# Patient Record
Sex: Female | Born: 1990 | Hispanic: No | Smoking: Never smoker
Health system: Southern US, Community
[De-identification: ages and names within clinical notes are randomized; demographics above are authoritative.]

## PROBLEM LIST (undated history)

## (undated) DIAGNOSIS — M79A29 Nontraumatic compartment syndrome of unspecified lower extremity: Secondary | ICD-10-CM

## (undated) DIAGNOSIS — G8929 Other chronic pain: Secondary | ICD-10-CM

## (undated) DIAGNOSIS — R110 Nausea: Secondary | ICD-10-CM

## (undated) DIAGNOSIS — G9050 Complex regional pain syndrome I, unspecified: Secondary | ICD-10-CM

## (undated) DIAGNOSIS — M545 Low back pain: Secondary | ICD-10-CM

## (undated) HISTORY — PX: TONSILLECTOMY: SUR1361

## (undated) HISTORY — DX: Low back pain: M54.5

## (undated) HISTORY — DX: Complex regional pain syndrome I, unspecified: G90.50

## (undated) HISTORY — DX: Nontraumatic compartment syndrome of unspecified lower extremity: M79.A29

## (undated) HISTORY — PX: LASIK: SHX215

## (undated) HISTORY — PX: FASCIOTOMY: SHX132

## (undated) HISTORY — DX: Other chronic pain: G89.29

## (undated) HISTORY — DX: Nausea: R11.0

---
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
## 2010-06-08 DIAGNOSIS — M79604 Pain in right leg: Secondary | ICD-10-CM | POA: Insufficient documentation

## 2010-09-24 DIAGNOSIS — M79A29 Nontraumatic compartment syndrome of unspecified lower extremity: Secondary | ICD-10-CM

## 2010-09-24 HISTORY — DX: Nontraumatic compartment syndrome of unspecified lower extremity: M79.A29

## 2010-11-08 ENCOUNTER — Inpatient Hospital Stay (INDEPENDENT_AMBULATORY_CARE_PROVIDER_SITE_OTHER)
Admission: RE | Admit: 2010-11-08 | Discharge: 2010-11-08 | Disposition: A | Discharge status: Home / Self Care | Payer: 59 | Source: Ambulatory Visit | Attending: Family Medicine | Admitting: Family Medicine

## 2010-11-08 DIAGNOSIS — L02419 Cutaneous abscess of limb, unspecified: Secondary | ICD-10-CM

## 2010-11-08 DIAGNOSIS — L03119 Cellulitis of unspecified part of limb: Secondary | ICD-10-CM

## 2010-11-18 ENCOUNTER — Inpatient Hospital Stay (INDEPENDENT_AMBULATORY_CARE_PROVIDER_SITE_OTHER)
Admission: RE | Admit: 2010-11-18 | Discharge: 2010-11-18 | Disposition: A | Discharge status: Home / Self Care | Payer: 59 | Source: Ambulatory Visit | Attending: Family Medicine | Admitting: Family Medicine

## 2010-11-18 DIAGNOSIS — L02419 Cutaneous abscess of limb, unspecified: Secondary | ICD-10-CM

## 2010-11-18 DIAGNOSIS — L03119 Cellulitis of unspecified part of limb: Secondary | ICD-10-CM

## 2011-06-29 DIAGNOSIS — G8929 Other chronic pain: Secondary | ICD-10-CM | POA: Insufficient documentation

## 2011-06-29 DIAGNOSIS — IMO0002 Reserved for concepts with insufficient information to code with codable children: Secondary | ICD-10-CM | POA: Insufficient documentation

## 2011-10-13 DIAGNOSIS — G9050 Complex regional pain syndrome I, unspecified: Secondary | ICD-10-CM

## 2011-10-13 HISTORY — DX: Complex regional pain syndrome I, unspecified: G90.50

## 2014-05-24 DIAGNOSIS — E669 Obesity, unspecified: Secondary | ICD-10-CM | POA: Insufficient documentation

## 2014-10-21 ENCOUNTER — Other Ambulatory Visit (HOSPITAL_COMMUNITY): Payer: Self-pay | Admitting: Orthopedic Surgery

## 2014-10-21 DIAGNOSIS — M84369A Stress fracture, unspecified tibia and fibula, initial encounter for fracture: Secondary | ICD-10-CM

## 2014-10-25 ENCOUNTER — Ambulatory Visit (HOSPITAL_COMMUNITY)
Admission: RE | Admit: 2014-10-25 | Discharge: 2014-10-25 | Disposition: A | Discharge status: Home / Self Care | Payer: 59 | Source: Ambulatory Visit | Attending: Orthopedic Surgery | Admitting: Orthopedic Surgery

## 2014-10-25 ENCOUNTER — Encounter (HOSPITAL_COMMUNITY)
Admission: RE | Admit: 2014-10-25 | Discharge: 2014-10-25 | Disposition: A | Discharge status: Home / Self Care | Payer: 59 | Source: Ambulatory Visit | Attending: Orthopedic Surgery | Admitting: Orthopedic Surgery

## 2014-10-25 DIAGNOSIS — M84369A Stress fracture, unspecified tibia and fibula, initial encounter for fracture: Secondary | ICD-10-CM

## 2014-10-25 DIAGNOSIS — M79606 Pain in leg, unspecified: Secondary | ICD-10-CM | POA: Diagnosis not present

## 2014-10-25 MED ORDER — TECHNETIUM TC 99M MEDRONATE IV KIT
25.0000 | PACK | Freq: Once | INTRAVENOUS | Status: AC | PRN
  Administered 2014-10-25: 25 via INTRAVENOUS

## 2015-03-03 DIAGNOSIS — R252 Cramp and spasm: Secondary | ICD-10-CM | POA: Insufficient documentation

## 2016-02-11 DIAGNOSIS — R110 Nausea: Secondary | ICD-10-CM

## 2016-02-11 DIAGNOSIS — M545 Low back pain, unspecified: Secondary | ICD-10-CM | POA: Insufficient documentation

## 2016-02-11 DIAGNOSIS — G8929 Other chronic pain: Secondary | ICD-10-CM

## 2016-02-11 HISTORY — DX: Nausea: R11.0

## 2016-02-11 HISTORY — DX: Low back pain, unspecified: M54.50

## 2016-02-11 HISTORY — DX: Other chronic pain: G89.29

## 2016-05-26 DIAGNOSIS — Z7189 Other specified counseling: Secondary | ICD-10-CM | POA: Diagnosis not present

## 2016-10-21 DIAGNOSIS — J209 Acute bronchitis, unspecified: Secondary | ICD-10-CM | POA: Diagnosis not present

## 2017-04-01 DIAGNOSIS — M79605 Pain in left leg: Secondary | ICD-10-CM | POA: Diagnosis not present

## 2017-04-01 DIAGNOSIS — M25561 Pain in right knee: Secondary | ICD-10-CM | POA: Diagnosis not present

## 2017-04-18 ENCOUNTER — Other Ambulatory Visit: Payer: Self-pay | Admitting: Sports Medicine

## 2017-04-18 DIAGNOSIS — M79605 Pain in left leg: Secondary | ICD-10-CM

## 2017-04-18 DIAGNOSIS — M25561 Pain in right knee: Secondary | ICD-10-CM

## 2017-04-27 ENCOUNTER — Ambulatory Visit
Admission: RE | Admit: 2017-04-27 | Discharge: 2017-04-27 | Disposition: A | Discharge status: Home / Self Care | Payer: 59 | Source: Ambulatory Visit | Attending: Sports Medicine | Admitting: Sports Medicine

## 2017-04-27 DIAGNOSIS — M25561 Pain in right knee: Secondary | ICD-10-CM | POA: Diagnosis not present

## 2017-04-27 DIAGNOSIS — M79662 Pain in left lower leg: Secondary | ICD-10-CM | POA: Diagnosis not present

## 2017-04-27 DIAGNOSIS — M79605 Pain in left leg: Secondary | ICD-10-CM

## 2017-04-27 MED ORDER — GADOBENATE DIMEGLUMINE 529 MG/ML IV SOLN
20.0000 mL | Freq: Once | INTRAVENOUS | Status: AC | PRN
  Administered 2017-04-27: 20 mL via INTRAVENOUS

## 2017-05-03 DIAGNOSIS — R1013 Epigastric pain: Secondary | ICD-10-CM | POA: Diagnosis not present

## 2017-05-03 DIAGNOSIS — K219 Gastro-esophageal reflux disease without esophagitis: Secondary | ICD-10-CM | POA: Diagnosis not present

## 2017-05-03 DIAGNOSIS — R300 Dysuria: Secondary | ICD-10-CM | POA: Diagnosis not present

## 2017-05-09 DIAGNOSIS — M25561 Pain in right knee: Secondary | ICD-10-CM | POA: Diagnosis not present

## 2017-05-09 DIAGNOSIS — M25562 Pain in left knee: Secondary | ICD-10-CM | POA: Diagnosis not present

## 2017-08-18 DIAGNOSIS — J0100 Acute maxillary sinusitis, unspecified: Secondary | ICD-10-CM | POA: Diagnosis not present

## 2017-12-07 DIAGNOSIS — Z01419 Encounter for gynecological examination (general) (routine) without abnormal findings: Secondary | ICD-10-CM | POA: Diagnosis not present

## 2018-02-22 ENCOUNTER — Ambulatory Visit (INDEPENDENT_AMBULATORY_CARE_PROVIDER_SITE_OTHER): Payer: 59 | Admitting: Family Medicine

## 2018-02-22 ENCOUNTER — Encounter: Payer: Self-pay | Admitting: Family Medicine

## 2018-02-22 ENCOUNTER — Ambulatory Visit (INDEPENDENT_AMBULATORY_CARE_PROVIDER_SITE_OTHER): Payer: 59

## 2018-02-22 VITALS — BP 144/76 | HR 85 | Ht 72.0 in | Wt 289.0 lb

## 2018-02-22 DIAGNOSIS — G8929 Other chronic pain: Secondary | ICD-10-CM

## 2018-02-22 DIAGNOSIS — M25512 Pain in left shoulder: Secondary | ICD-10-CM

## 2018-02-22 NOTE — Patient Instructions (Signed)
Thank you for coming in today.  You should hear about MRI scheduling soon.  Let me know if you do not hear anything.    SLAP Lesions Superior labrum anterior posterior (SLAP) lesions are injuries to part of the connective tissue (cartilage) of the shoulder joint. The top of the upper arm bone (humerus) fits into a socket in the shoulder blade to form the shoulder joint. There is a firm rim of cartilage (labrum) around the edge of the socket. The labrum helps to deepen the socket and hold the humerus in place. If a certain part of the labrum becomes frayed or torn, it is called a SLAP lesion. A SLAP lesion can cause shoulder pain, instability, and weakness. SLAP lesions are common among athletes who play sports that involve repeated overhead movements. SLAP lesions may include a tear in the cord of tissue that attaches the muscle in the front of the upper arm to the shoulder blade (proximal biceps tendon). What are the causes? This condition may be caused by:  A sudden (acute) injury, which can result from: ? Falling on an outstretched arm. ? Movement of the shoulder joint out of its normal place (dislocation). ? A direct hit to the shoulder.  Wear and tear over time, which can result from doing activities or sports that involve overhead arm movements.  What increases the risk? The following factors may make you more likely to develop a SLAP lesion:  Having had a dislocated shoulder in the past.  Being age 27 or older.  Playing certain sports, such as: ? Sports that involve repeated overhead movements, such as baseball or volleyball. ? Sports that put backward pressure on the arms when the arms are overhead, such as gymnastics or basketball. ? Contact sports.  Lifting weights.  What are the signs or symptoms? The main symptom of this condition is shoulder pain that gets worse when lifting a heavy object or raising the arm overhead. Other signs and symptoms may include:  Feeling  like your shoulder is locking, catching, grinding, or popping.  Loss of strength.  Stiffness and limited range of motion.  Loss of throwing power ("dead arm").  How is this diagnosed? This condition may be diagnosed based on:  Your symptoms.  Your medical history.  A physical exam.  Imaging tests, such as MRIs.  How is this treated? Treatment for this condition may include:  Resting your shoulder by avoiding activities that cause shoulder pain.  NSAIDs to help reduce pain and swelling.  Physical therapy to improve strength and range of motion.  Surgery. This may be done if other treatment methods do not help. Surgery may involve: ? Removing frayed pieces of the labrum. ? Repairing tears. ? Reattaching the labrum. ? Repairing the biceps tendon.  Follow these instructions at home: Managing pain, stiffness, and swelling  If directed, put ice on the injured area. ? Put ice in a plastic bag. ? Place a towel between your skin and the bag. ? Leave the ice on for 20 minutes, 2-3 times a day. Driving  Do not drive or operate heavy machinery while taking prescription pain medicine.  Ask your health care provider when it is safe for you to drive. Activity  Return to your normal activities as told by your health care provider. Ask your health care provider what activities are safe for you.  Do exercises as told by your health care provider. General instructions   Do not use any tobacco products, such as cigarettes, chewing  tobacco, or e-cigarettes. Tobacco can delay bone healing. If you need help quitting, ask your health care provider.  Take over-the-counter and prescription medicines only as told by your health care provider.  Keep all follow-up visits as told by your health care provider. This is important. How is this prevented?  Be safe and responsible while being active to avoid falls.  Maintain physical fitness, including strength and flexibility. Contact a  health care provider if:  Your symptoms have not improved after 6 months of treatment.  Your symptoms get worse instead of getting better. This information is not intended to replace advice given to you by your health care provider. Make sure you discuss any questions you have with your health care provider. Document Released: 03/29/2005 Document Revised: 12/04/2015 Document Reviewed: 03/01/2015 Elsevier Interactive Patient Education  [employer].

## 2018-02-22 NOTE — Progress Notes (Signed)
Subjective:    CC: Left shoulder pain  HPI: Gabriella Barnett has a 427-month history of left shoulder pain gradually progressing with no acute injury.  She is left-hand dominant.  She works as a Radiation protection practitionerparamedic and notes that reaching across her body and overhead lifting are especially problematic.  She notes pain with these activities and this notes pain at night.  She notes the pain is primarily in the anterior shoulder but sometimes felt in the lateral shoulder.  Occasionally she notes numbness and tingling into her left hand as well.  She thinks the entire hand gets numb and tingly but is not quite sure of the exact distribution.  She notes popping and clicking with shoulder motion as well.  She denies any severe weakness but does note some difficulty with overhead motion.  For example when she picks a heavy monitor up and pulls it across her body this is very painful and obnoxious.  She notes this does interfere with her job duties.  He is also having difficulty at work and no longer able to exercise normally.  She likes to rock climb and cannot rock climb because of her shoulder pain.  She is been doing physical therapy and massage therapy for this off and on.  Her most recent episode of physical therapy was 2 months ago lasting 6 weeks.  This did not help.  Past medical history, Surgical history, Family history not pertinant except as noted below, Social history, Allergies, and medications have been entered into the medical record, reviewed, and no changes needed.   Review of Systems: No headache, visual changes, nausea, vomiting, diarrhea, constipation, dizziness, abdominal pain, skin rash, fevers, chills, night sweats, weight loss, swollen lymph nodes, body aches, joint swelling, muscle aches, chest pain, shortness of breath, mood changes, visual or auditory hallucinations.   Objective:    Vitals:   02/22/18 0803  BP: (!) 144/76  Pulse: 85   General: Well Developed, well nourished, and in no  acute distress.  Neuro/Psych: Alert and oriented x3, extra-ocular muscles intact, able to move all 4 extremities, sensation grossly intact. Skin: Warm and dry, no rashes noted.  Respiratory: Not using accessory muscles, speaking in full sentences, trachea midline.  Cardiovascular: Pulses palpable, no extremity edema. Abdomen: Does not appear distended. MSK:  C-spine: Nontender to spinal midline.  Normal neck motion.  Slight weakness to left shoulder abduction 4/5 otherwise normal throughout bilateral upper extremities.  Left shoulder normal-appearing.   Not particularly tender. Range of motion: Abduction full popping with range of motion. Internal rotation limited to thoracic spine. External rotation full. Nonpainful Hawkins test but popping with Hawkins test.  Mildly positive Neer's test. Positive crossover arm compression test. Positive anterior apprehension test and positive relocation test. Positive clunk test. Positive empty can test. Strength is intact to external rotation and internal rotation.  Slightly decreased to 4/5 to abduction.  Right shoulder normal-appearing nontender normal motion normal strength negative impingement testing of labral testing.  Left elbow normal-appearing nontender normal motion negative Tinel's over cubital tunnel.  Left wrist normal-appearing nontender negative Tinel's over carpal tunnel.  Negative Phalen's test bilaterally.  Pulses intact bilateral upper extremities.   Lab and Radiology Results X-ray images left shoulder personally independently reviewed No acute fractures.  No significant degenerative changes.  [facility]. Await formal radiology review  Impression and Recommendations:    Assessment and Plan: 27 y.o. female with  Left shoulder pain ongoing for 6 months.  Patient has attempted and  failed trial conservative management including 6 weeks of physical therapy.  She has mechanical symptoms including popping  clicking and has a positive labral testing including anterior apprehension test.  Plan for MRI arthrogram to evaluate for rotator cuff tear/impingement and labral tear.  Recheck after MRI.  Patient states that she is willing to consider surgery if needed..   Orders Placed This Encounter  Procedures  . DG Shoulder Left    Standing Status:   Future    Number of Occurrences:   1    Standing Expiration Date:   04/25/2019    Order Specific Question:   Reason for Exam (SYMPTOM  OR DIAGNOSIS REQUIRED)    Answer:   eval shoulder pain left    Order Specific Question:   Is patient pregnant?    Answer:   No    Order Specific Question:   Preferred imaging location?    Answer:   [facility]    Order Specific Question:   Radiology Contrast Protocol - do NOT remove file path    Answer:   \\[email or web address]  . MR SHOULDER LEFT W CONTRAST    Standing Status:   Future    Standing Expiration Date:   04/25/2019    Order Specific Question:   If indicated for the ordered procedure, I authorize the administration of contrast media per Radiology protocol    Answer:   Yes    Order Specific Question:   What is the patient's sedation requirement?    Answer:   No Sedation    Order Specific Question:   Does the patient have a pacemaker or implanted devices?    Answer:   No    Order Specific Question:   Radiology Contrast Protocol - do NOT remove file path    Answer:   \\[email or web address]\[email or web address]    Order Specific Question:   Preferred imaging location?    Answer:   [facility] (table limit-350lbs)   No orders of the defined types were placed in this encounter.   Discussed warning signs or symptoms. Please see discharge instructions. Patient expresses understanding.

## 2018-03-13 ENCOUNTER — Ambulatory Visit (INDEPENDENT_AMBULATORY_CARE_PROVIDER_SITE_OTHER): Payer: 59 | Admitting: Family Medicine

## 2018-03-13 ENCOUNTER — Ambulatory Visit (INDEPENDENT_AMBULATORY_CARE_PROVIDER_SITE_OTHER): Payer: 59

## 2018-03-13 ENCOUNTER — Encounter: Payer: Self-pay | Admitting: Family Medicine

## 2018-03-13 VITALS — BP 133/64 | HR 72 | Wt 293.0 lb

## 2018-03-13 DIAGNOSIS — M25512 Pain in left shoulder: Secondary | ICD-10-CM | POA: Diagnosis not present

## 2018-03-13 DIAGNOSIS — G8929 Other chronic pain: Secondary | ICD-10-CM | POA: Diagnosis not present

## 2018-03-13 DIAGNOSIS — M7582 Other shoulder lesions, left shoulder: Secondary | ICD-10-CM

## 2018-03-13 DIAGNOSIS — M19012 Primary osteoarthritis, left shoulder: Secondary | ICD-10-CM | POA: Diagnosis not present

## 2018-03-13 NOTE — Progress Notes (Signed)
Patient presents to clinic for previously arranged gadolinium contrast injection for left shoulder MRI arthrogram scheduled today.   Procedure: Real-time Ultrasound Guided Injection of left glenohumeral joint Device: GE Logiq E   Images permanently stored and available for review in the ultrasound unit. Verbal informed consent obtained.  Discussed risks and benefits of procedure. Warned about infection bleeding damage to structures skin hypopigmentation and fat atrophy among others. Patient expresses understanding and agreement Time-out conducted.   Noted no overlying erythema, induration, or other signs of local infection.   Skin prepped in a sterile fashion.   Local anesthesia: Topical Ethyl chloride.   With sterile technique and under real time ultrasound guidance:  3 mL of Marcaine, 40 mg of Kenalog, 0.1 mL of gadolinium contrast, 5 mL of sterile saline injected easily.   Completed without difficulty   Pain immediately resolved suggesting accurate placement of the medication.   Advised to call if fevers/chills, erythema, induration, drainage, or persistent bleeding.   Images permanently stored and available for review in the ultrasound unit.  Impression: Technically successful ultrasound guided injection.

## 2018-03-13 NOTE — Patient Instructions (Signed)
Thank you for coming in today. Call or go to the ER if you develop a large red swollen joint with extreme pain or oozing puss.   Get MRI now.  [facility].  If I have to have a longer conversation or a detailed conversation I will ask you to come back otherwise we likely do the results via phone.

## 2018-11-23 ENCOUNTER — Other Ambulatory Visit: Payer: Self-pay | Admitting: Gastroenterology

## 2018-11-23 DIAGNOSIS — R1084 Generalized abdominal pain: Secondary | ICD-10-CM

## 2018-11-30 ENCOUNTER — Other Ambulatory Visit: Payer: 59

## 2018-12-01 ENCOUNTER — Ambulatory Visit
Admission: RE | Admit: 2018-12-01 | Discharge: 2018-12-01 | Disposition: A | Discharge status: Home / Self Care | Payer: 59 | Source: Ambulatory Visit | Attending: Gastroenterology | Admitting: Gastroenterology

## 2018-12-01 DIAGNOSIS — R1084 Generalized abdominal pain: Secondary | ICD-10-CM

## 2020-04-17 ENCOUNTER — Emergency Department (HOSPITAL_COMMUNITY)
Admission: EM | Admit: 2020-04-17 | Discharge: 2020-04-17 | Disposition: A | Discharge status: Home / Self Care | Payer: No Typology Code available for payment source | Attending: Emergency Medicine | Admitting: Emergency Medicine

## 2020-04-17 ENCOUNTER — Emergency Department (HOSPITAL_COMMUNITY): Payer: No Typology Code available for payment source

## 2020-04-17 ENCOUNTER — Encounter (HOSPITAL_COMMUNITY): Payer: Self-pay | Admitting: Emergency Medicine

## 2020-04-17 DIAGNOSIS — S39012A Strain of muscle, fascia and tendon of lower back, initial encounter: Secondary | ICD-10-CM | POA: Diagnosis not present

## 2020-04-17 DIAGNOSIS — S40022A Contusion of left upper arm, initial encounter: Secondary | ICD-10-CM

## 2020-04-17 DIAGNOSIS — Y92410 Unspecified street and highway as the place of occurrence of the external cause: Secondary | ICD-10-CM | POA: Diagnosis not present

## 2020-04-17 DIAGNOSIS — S4992XA Unspecified injury of left shoulder and upper arm, initial encounter: Secondary | ICD-10-CM | POA: Diagnosis present

## 2020-04-17 NOTE — ED Notes (Signed)
Patient transported to X-ray 

## 2020-04-17 NOTE — Discharge Instructions (Addendum)
Use ibuprofen and/or Tylenol as needed for discomfort.  Heating pads might also help with muscle spasms.  Follow-up with your primary care doctor if your symptoms are not improving within the next few days.  Return here as needed if you have any worsening symptoms including chest pain, abdominal pain, shortness of breath, vomiting or other worsening symptoms.
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
## 2021-05-11 ENCOUNTER — Encounter: Payer: Self-pay | Admitting: Psychology

## 2021-05-11 ENCOUNTER — Ambulatory Visit: Payer: 59 | Admitting: Psychology

## 2021-05-11 DIAGNOSIS — F411 Generalized anxiety disorder: Secondary | ICD-10-CM

## 2021-05-11 DIAGNOSIS — F3341 Major depressive disorder, recurrent, in partial remission: Secondary | ICD-10-CM

## 2021-05-11 NOTE — Progress Notes (Signed)
                Linna Thebeau, PhD 

## 2021-05-11 NOTE — Progress Notes (Signed)
[facility] Initial Adult Testing Intake  Name: Gabriella Barnett Date: 05/11/2021 MRN: [id number] DOB: 1990-07-26 PCP: Chesley Noon, MD  Time spent: 3:00 - 3:45pm    Met with patient for initial interview.  Patient was at home due to COVID-19 restrictions and session was conducted from therapist's office via video conferencing.  Patient verbally consented to telehealth.    Reason for Visit /Presenting Problem: Patient was suspected of having Autism spectrum disorder by pediatrician bu mother did not compete any testing for fears that patient would be removed from school.  Patient reported going through childhood with anxiety and being sensory overloaded.  Patient concerned because they are about to have a child and they are concerned about their potential parenting ability.    DEVELOPMENTAL HISTORY:   Developmental milestones/early delays? Had speech delays.  Was I speech therapy all through elementary school.  Was late in developing reading and reading comprehension.  Had social delays including difficulty understanding unwritten social rules and nonverbal social cues like sarcasm.    Gross Motor: Adequately developed - played basketball and lacrosse during middle high school  Fine Motor:Had Occupational therapy when young related to poor hand eye coordination but did not last past kindergarten. No problems with handwriting or drawing.  Delayed with buttons and shoe tying.  Caught up by 3rd grade.    Speech / Language: Still struggles some with articulation.  Will miss or replace words when reading out loud.  Struggles expressing thoughts ands feelings, especially when gets overwhelmed.   Self-Help Skills (toileting, dressing): Overly sensitive to showering (water hitting skin).  Cut hair short and making other accommodations for this.  No issues with independent skills.   Social / Emotional: Still has trouble understanding sarcasm.  Trouble communication with others  when can't see see them.  Still prefers to be by self and just very small set of friends/relations.  Was very different from peers growing up and mother had to help patient navigate social situations, otherwise she would not have had any friends.  Avoids activities where there will be large numbers of people, even if it is preferred activity. Sensory Integration Issues: Water, clothing, scents, and touch      EDUCATIONAL HISTORY: Current School - completed Master's degree from [employer] in Criminal Justice/Public administration   School Concerns: enjoyed Immunologist after elementary school.  Graduate [employer] program with 4.0   Previous School Hx (Preschool, Elementary, Middle, HS, College): Struggled with reading and reading comprehension. And foreign languages  History of School Based Services or Accommodations: Speech therapy durung elementary school.    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  Accidents / Traumas: None  Hospitalizations / Operations: Tonsil removal age 31 and surgeries related to the problems with her legs  Allergies: Penicillin, mushrooms, bees   Seizures, Concussions, or HI: None  Current Medications: Meds. Tried:   Sexuality: Gender Identification: Preferred Pronouns:    Substance Abuse History: Current substance abuse:  Rare alcohol use as it further impairs her sensory awareness. No drug use.                                                                                                                                 FAMILY MENTAL HEALTH HISTORY:  Autism - father    Past Psychiatric History:   Previous psychological history is significant for  PTSD - tested positive on screenings but no testing Outpatient Providers:None History of Psych Hospitalization: No  Psychological Testing:  None                                                                                                                      Living situation: the patient lives with their spouse (wife)  Sexual Orientation: Lesbian - female  Relationship Status: married  Name of spouse / other:Gabriella Barnett If a parent, number of children / ages:None  Support Systems: wife, parents, large family   Abuse History:  Victim of: No.,  None    Report needed: No. Victim of Neglect:No. 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 History of legal issue / charges:  None  Any cultural differences that may affect / interfere with treatment:  not applicable   Recreation/Hobbies: working with farm animals - own a farm  Current Stressors: Other: work related issues - very busy system with low staffing    Strengths: Recognizes when getting overwhelmed or overstimulated and will remove self before it becomes too much.  Keeping routines helps, along with compensation strategies for sensory sensitivity  Reported Symptoms:  Trouble falling and staying asleep.  Vivid dreams  and frequent re-occurring nightmares.  No changes in appetite.  Variable energy during the day.  Has periods of prolonged depressed mood.  Depressive swings are getting deeper and longer as she progresses.  Feels hopeless and helpless during depressive states but  not currently.  No thoughts of self harm.  No mania.  Panic attacks - has some period of heightened anxiety daily.  Triggered by sensory issues, events not going as planned, going to an undesired event/interaction.  Generalized worry and social anxiety.  Obsessive thoughts - thinks something bad will or cold happen.  Compulsions - none.  Able to attend but hyper-fixates, not distractible (hard to get attention away from task, organization is idiosyncratic.  Restless/fidgety, Not impulsive - over-analyzes before does anything.  Trouble interacting with most peers and reading nonverbal cues.  Friend close relationships outside of wife and family.  Repetitive phrases, noises, hand flapping, Over-fixates on specific topics for 2-3 months at a time (even for useless activities like beekeeping despite being allergic  to bees).  Struggles with change (daily change as well as larger changes like moving).         Mental Status Exam:  Appearance: Casual and masculine looking - very short hair     Behavior: Appropriate Motor: Normal Speech/Language: Negative Affect: Constricted Mood: euthymic Thought process: normal Thought content: WNL Sensory/Perceptual disturbances: WNL Orientation: person place time and situation  Attention: Fair  Concentration: Good Memory: WNL Fund of knowledge: Good Insight: Good Judgment: Good Impulse Control: Good  Risk Assessment: Danger to Self:  No Self-injurious Behavior: No Danger to Others: No Duty to Warn:no Physical Aggression / Violence:No  Access to Firearms a concern: No  Gang Involvement:No  Patient / guardian was educated about steps to take if suicide or homicide risk level increases between visits: no  Family History:  Family History  Problem Relation Age of Onset   Cancer Paternal Grandmother        Ovarian    Medical History/Surgical History: reviewed Past Medical History:  Diagnosis Date   Chronic bilateral low back pain without sciatica  02/11/2016   CRPS (complex regional pain syndrome type I) 10/13/2011   Nausea 02/11/2016   Overview:  Due to shift changes.   Nontraumatic compartment syndrome of lower extremity 09/24/2010   Overview:  2012 and 2013  Last Assessment & Plan:  Relevant Hx: Course: Daily Update: Today's Plan:    Past Surgical History:  Procedure Laterality Date   FASCIOTOMY     Multiple repeat bilateral lower extremity for exertional compartment syndrome bilaterally   LASIK     TONSILLECTOMY      Medications: Current Outpatient Medications  Medication Sig Dispense Refill   cyclobenzaprine (FLEXERIL) 10 MG tablet Take by mouth.     EPINEPHrine 0.3 mg/0.3 mL IJ SOAJ injection Inject into the muscle.     ondansetron (ZOFRAN) 4 MG tablet Take 4 mg by mouth every 8 (eight) hours as needed for nausea or vomiting.     promethazine (PHENERGAN) 25 MG tablet Take 1 tablet every 4-6 hours as needed for nausea/vomiting.     No current facility-administered medications for this visit.    Allergies  Allergen Reactions   Succinylcholine Rash and Other (See Comments)    [place] heritage-"malignant hypothermia"     Mushroom Extract Complex Nausea And Vomiting    Vomiting   Penicillins Other (See Comments)    Hallucinations    Wasp Venom Rash    Large local reaction    Diagnoses:  Generalized  anxiety disorder  Recurrent major depression in partial remission ([place])  R/O ASD & PTSD  Plan of Care: Patient presents with a history of anxiety and depressed mood related to social interaction impairment and sensory hypersensitivity.  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  Patient's pediatrician suspected autism Spectrum disorder (ASD) but testing was never conducted. Family history also significant for ASD.  Testing recommended to evaluate for ASD as  well as other conditions that may be affecting attention.  Test Battery - In Person K-BIT-2, CNSVS, DASS, Adult OCD, PTSD Checklist, ADOS 2 Module 4, SCQ-Current (spouse), AQ Test (self).    Rainey Pines, PhD

## 2021-05-11 NOTE — Progress Notes (Signed)
                Mikaiya Tramble, PhD 

## 2021-05-26 ENCOUNTER — Other Ambulatory Visit: Payer: Self-pay

## 2021-05-26 ENCOUNTER — Encounter: Payer: Self-pay | Admitting: Psychology

## 2021-05-26 ENCOUNTER — Ambulatory Visit (INDEPENDENT_AMBULATORY_CARE_PROVIDER_SITE_OTHER): Payer: 59 | Admitting: Psychology

## 2021-05-26 DIAGNOSIS — F411 Generalized anxiety disorder: Secondary | ICD-10-CM

## 2021-05-26 DIAGNOSIS — F3341 Major depressive disorder, recurrent, in partial remission: Secondary | ICD-10-CM

## 2021-05-26 NOTE — Progress Notes (Signed)
[facility] Counselor/Therapist Progress Note  Patient ID: Gabriella Barnett, MRN: [id number],    Date: 05/26/2021  Time Spent: 12:00 - 3:00pm   Treatment Type: Testing  Met with patient for testing session.  Patient was at the clinic and session was conducted from therapist's office in person.  Reported Symptoms: Reason for Visit /Presenting Problem: Patient presents with a history of anxiety and depressed mood related to social interaction impairment and sensory hypersensitivity.  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  Patient's pediatrician suspected autism Spectrum disorder (ASD) but testing was never conducted. Family history also significant for ASD.  Testing recommended to evaluate for ASD as well as other conditions that may be affecting attention.  Mental Status Exam: Appearance:  Neat and Well Groomed     Behavior: Appropriate  Motor: Normal  Speech/Language:  Clear and Coherent and Normal Rate  Affect: Appropriate  Mood: normal  Thought process: Lack of elaboration  Thought content:   WNL  Sensory/Perceptual disturbances:   WNL  Orientation: oriented to person, place, time/date, and situation  Attention: Good  Concentration: Good  Memory: WNL - strong  Fund of knowledge:  Good  Insight:   Good  Judgment:  Good  Impulse Control: Good   Risk Assessment: Danger to Self:  No Self-injurious Behavior: No Danger to Others: No  Behavior Observations: Patient was cooperative and displayed good effort. Attention and concentration were adequate overall, as patient was able to focus on tasks and complete them as instructed.  Mood was euthymic with restricted affect.  The results appear representative of current functioning.    Subjective: Testing included the K-BIT-2 (0.75 hrs. for testing and  scoring), CNS Vital Signs (0.75 hrs.), ADOS 2 Module 4 ( 1 hr.) and AQ Test (0.5 hrs.). Patient was given the SCQ-Current to have spouse complete at home and return.     Diagnosis:Generalized anxiety disorder  Major depressive disorder, recurrent episode, in partial remission  Plan: Testing complete. Report writing to be conducted followed by  interactive feedback next session.     Rainey Pines, PhD

## 2021-05-26 NOTE — Progress Notes (Signed)
                Ansen Sayegh, PhD 

## 2021-05-28 ENCOUNTER — Encounter: Payer: Self-pay | Admitting: Psychology

## 2021-05-28 NOTE — Progress Notes (Signed)
Gabriella Barnett is a 31 y.o. female patient. Report writing was competed from 8:30 - 10:30am ( 2 hrs.).  Interactive feedback to be conducted next session. Report to be attached to the feedback progress note.    Bryson Dames, PhD

## 2021-06-17 ENCOUNTER — Encounter: Payer: Self-pay | Admitting: Psychology

## 2021-06-17 ENCOUNTER — Ambulatory Visit (INDEPENDENT_AMBULATORY_CARE_PROVIDER_SITE_OTHER): Payer: 59 | Admitting: Psychology

## 2021-06-17 DIAGNOSIS — F330 Major depressive disorder, recurrent, mild: Secondary | ICD-10-CM | POA: Diagnosis not present

## 2021-06-17 DIAGNOSIS — F840 Autistic disorder: Secondary | ICD-10-CM

## 2021-06-17 DIAGNOSIS — F4310 Post-traumatic stress disorder, unspecified: Secondary | ICD-10-CM

## 2021-06-17 NOTE — Progress Notes (Signed)
[facility] Testing Progress Note ? ?Patient ID: Gabriella Barnett, MRN: [id number],   ? ?Date: 06/17/2021 ? ?Time Spent: 3:00 - 3:45pm  ? ?Treatment Type:  Testing - Feedback Session ? ?Met with patient to review results of testing.  Patient was at home due to COVID-19 restrictions and session was conducted from therapist's office via video conferencing.  Patient verbally consented to telehealth.      ? ?Reported Symptoms: Patient presents with a history of anxiety and depressed mood related to social interaction impairment and sensory hypersensitivity.  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  Patient's pediatrician suspected autism Spectrum disorder (ASD) but testing was never conducted. Family history also significant for ASD.  Testing recommended to evaluate for ASD as well as other conditions that may be affecting attention. ? ?Subjective: Interactive feedback was conducted (1 hr.).  It was discussed how patient met the criterion for Autism Spectrum Disorder and PTSD along with how these conditions affect her ability to learn and relate to others.      Recommendations included discussing results with PCP or psychiatrist, developing a visual organization system, attending a social support group, and continuing individual counseling to help develop emotional and social skills while facing her trauma.  Mother expressed agreement with the results and recommendations.    ? ?Total Time of Testing: 8 hrs. ?Testing and Scoring: 3 hrs. ?Interactive Feedback:1 hr. ?Report Writing: 2 hrs.  ? ?   ?Diagnosis:PTSD (post-traumatic stress disorder) ? ?Autism spectrum disorder ? ?Major depressive disorder, recurrent episode, mild ([place]) ? ?Plan: Report to be sent to parent and referring provider.   ? ?Rainey Pines, PhD ? ? ? ?

## 2021-06-17 NOTE — Progress Notes (Signed)
Psychological Testing Report - Confidential  Identifying Information:               Patient's Name:   Calton Golds  Date of Birth:   11/13/1990     Age:                31 years  MRN#:                                   [id number]      Date of Assessment:              May 26, 2021         Purpose of Evaluation:  The purpose of the evaluation is to provide diagnostic information and treatment recommendations.     Referral Information: Ms. Fajardo was a 31 year old Caucasian Lesbian female.  She was initially suspected of having Autism Spectrum Disorder (ASD) by her pediatrician, but her mother did not have Ms. Kooi complete any testing for fears that she would be removed from school.  Ms. Mcphee reported going through childhood with anxiety and having frequent sensory overload.  Ms. Rufer expressed concerned because she and her wife are about to have a child and they are concerned about her parenting ability.       Relevant Background Information:  Developmental history was reported to be significant for speech delays.  Ms. Scaduto received speech therapy all through elementary school and was late in developing reading and reading comprehension.  She had social delays including difficulty understanding unwritten social rules and nonverbal social cues like sarcasm.  Gross Motor skills were reported to be adequately developed, as Ms. Albritton played basketball and lacrosse during middle and high school.  Regarding fine motor development, Ms. Bramer received Occupational Therapy related to poor hand eye coordination, but this did not last past kindergarten.  She did not have any problems with handwriting or drawing.  Ms. Maxim was delayed with buttoning and shoe tying, but was able to do these by 3rd grade.  Regarding speech/language, Ms. Sorci still struggles some with articulation.  She will miss or replace words when reading out loud and struggle to express her  thoughts and feelings, especially when overwhelmed.  Self-Help Skills were reported to be adequate although she is overly sensitive to showering (water hitting her skin).  She cut her hair short and made other accommodations for this.  Ms. Guiles denied issues with independent skills.  Socially, Ms. Neuzil reported still having trouble understanding sarcasm.  She has difficulty communication with others when she can't see them.  She still prefers to be by herself and has just a very small set of friends/relations.  Ms. Crute reported being very different from her peers growing up and her mother had to help her navigate social situations, otherwise she would not have had any friends.  She avoids activities where there will be large numbers of people, even if it is a preferred activity.  Sensory Integration issues include hypersensitivity to water, clothing, scents, and touch.                         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  Accidents and physical traumas were denied.  Hospitalizations and surgeries include tonsil removal  age 31 and surgeries related to the problems with her legs.  She reported allergies to penicillin, mushrooms, and bees.  A history of seizures, concussions, or head injuries was denied.  Ms. Minner does not take any psychotropic medications.  Current substance use includes rare alcohol use as it further impairs her sensory awareness. Drug use was denied.  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  She tested positive for Post-Traumatic Stress disorder (PTSD) on screenings but has not received formal testing for this condition.  She is not seeing any outpatient psychotherapy providers, has not had psychiatric hospitalization, and has never participated in  psychological testing.                          Educationally, Ms. Schloemer reported that she completed Master's degree from [employer] in Criminal Justice/Public administration.  She reported that she enjoyed learning after elementary school and graduate her master's program with 4.0 GPA.  Early school history was significant for struggling with reading, reading comprehension, and foreign languages.  She received speech therapy during elementary school.  Strengths include science and math, long with [employer].   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  Leisure activities include working with animals, as she owns a farm.    Ms. Lavis is currently living with her wife Leotis Shames.  She does not have any children, but they are considering adopting a child.  Ms. Eaker reported being most supported by her wife, parents, and large family of origin.  She denied being a victim of abuse or neglect but indicated witnessing an extensive number of trauma related calls, many featuring children, while working as a Radiation protection practitioner. Family mental health history is significant for her father having Autism Spectrum Disorder.   Presenting Symptomology:  Ms. Figiel reported that she has trouble falling and staying asleep, experiencing vivid dreams and frequent re-occurring nightmares.  She denied changes in appetite but reported having variable energy during the day.  Ms. Colborn indicated having periods of prolonged depressed mood with the depressive swings getting deeper and longer as she progresses.  She feels hopeless and helpless during her depressive states but does not feel this way currently.  Thoughts of self-harm were denied, as was periods of mania.  Panic attacks were denied, although Ms. Sidman reported having periods of heightened  anxiety daily.  She gets triggered by sensory issues, events not going as planned, and participating in an undesired event or interaction.  Generalized worry and social anxiety were also indicated.  Obsessive thoughts include thinking something bad will or could happen.  Compulsions were denied.  Ms. Brucato reported being able to attend but hyper-fixate on specific aspects of a situation.  She is not distractible, but it is hard to get her attention away from a task.  Her organization was reported to be idiosyncratic.  She can get restless/fidgety, but is not impulsive, frequently overanalyzing before taking any action.  Ms. Dario reported having trouble interacting with most peers and reading nonverbal cues.  She has few close relationships outside of her wife and family.    Repetitive phrases, noises, and hand flapping were reported.  she over-fixates on specific topics for 2-3 months at a time (even for useless activities like beekeeping despite being allergic  to bees).  She struggles with change (daily change as well as larger changes like moving) and has much sensory hypersensitivity.              Procedures Administered: Carlos American Brief Intelligence Test - 2 CNS Vital Signs Adult OCD Inventory Depression, Anxiety, and Stress Scale PTSD Checklist for DSM 5 Structured Interview and Observation based on the ADOS 2 Module 4 Social Communication Questionnaire - Current - Spouse Report AQ Test - Self Report  Behavioral Observations:  Ms. Dalporto was cooperative and displayed good effort. Attention and concentration were adequate overall, as she was able to focus on tasks and complete them as instructed.  Mood was euthymic with restricted affect.  The results appear representative of current functioning.  Brief mental status indicated typical general orientation and alertness.  Recent, remote, and immediate memory were intact, as were working and delayed memory.  Judgement and insight were good.   Hallucinations, delusions, and thoughts of self-harm were denied.    Test Results and Interpretation:   General Intellectual Functioning:  The K-BIT 2 was used to assess Ms. Golob's performance across two areas of cognitive ability. When interpreting these scores, it is important to view the results as a snapshot of current intellectual functioning. As measured by the K-BIT 2, Ms. Schoolfield's Composite IQ score fell within the average range when compared to same age peers (CIQ = 69).  Ms. Enrico performance was consistent across the Primary Index Scores, as Verbal Comprehension (VCI = 103) and Perceptual Reasoning (PRI = 96) were both average.  This indicates age typical and relatively equal visual learning ability and language understanding.  On individual subtests, Ms. Lynch performed within the middle of the typical range for verbal knowledge, inferential thinking (riddles), and visual pattern analysis (matrices).  Overall, Ms. Rallis appears to have adequately developed verbal and nonverbal comprehension ability.    Carlos American Brief Intelligence Test - 2 Composite Score Summary  Composite Scores  Sum of Scaled Scores Composite Score Percentile Rank 90% Confidence Interval Qualitative Description  Verbal Comprehension  VC  90  103  58  97-109  Average  Nonverbal Reasoning PR 36   96 39 88-104 Average  Composite IQ  FSIQ -   99 47 94-104 Average   K-BIT 2 Continued Domain Subtest Name  Total Raw Score Scaled Score Percentile Rank  Verbal Verbal Knowledge VK 51 10 50  Comprehension Riddles Ri 39 11 63   Attention and Processing: The results of the CNS Vital Signs testing indicated high average neurocognitive processing ability, at a level above measured intellectual ability (average).  Regarding areas related to attention problems, simple attention, complex attention, cognitive flexibility, and executive function were average.  These are the domains most closely associated  with attention deficits.  Motor/psychomotor speed and reaction time were average, while processing speed was high average, indicating age typical hand speed and responsiveness, with fast thinking speed on computerized measures.  Visual memory and verbal memory were high to exceptionally high, indicating extremely well-developed memory for pictures and words.  Reading emotional expression was also deemed mildly below typical as the social acuity was low average.  The results suggest that Ms. Malkiewicz appears to have age typical ability attending to simple and complex tasks with a strong memory and adequate processing in general.  All measures were deemed valid.  CNS Vital Signs   Domain Scores Standard Score %ile Validity Indicator Guideline  Neurocognitive Index 111 77 Yes High Average  Composite Memory 139 99 Yes Very High  Verbal Memory 125 95 Yes High   Visual Memory 140 99 Yes Extremely High  Psychomotor speed 103 58 Yes Average  Reaction Time 107 68 Yes Average  Complex Attention 109 73 Yes Average  Cognitive Flexibility 97 42 Yes Average  Processing Speed 114 82 Yes High Average  Executive Function 98 45 Yes Average  Social Acuity 88 21 Yes Low Average  Simple Attention  107 68 Yes Average  Motor Speed 97 42 Yes Average         Behavioral - Emotional Functioning: Ratings of behavior and emotional functioning indicated significant emotional distress.  On the Adult OCD Inventory, Ms. Conover's total score fell within the mild range for obsessive-compulsive behavior as 8 of the 20 items were reported as occurring often or very often.  On the DASS, her scores for depression, anxiety, and stress all fell within the moderate range with 7 of the 20 items highly endorsed.  Most notably, severe impairment was noted on the PTSD checklist with15 of the 20 items endorsed as moderate or higher including distressing memories/dreams,  avoidance of thoughts and reminders, prolonged negative affect, and hypervigilance.    Regarding symptoms of ASD, information from the structured interview and observation indicated difficulty with several aspects of reciprocal social interaction with no instances of restricted repetitive behavior and communication impairment observed during this administration.  Within the area of communication, Ms. Goldston spoke in complete sentences.  There was little typical variation in her tone of voice but there was no observation of echolalia or repetitive speech.  She adequately reported non-routine events but did not offer any spontaneous personal information outside of answering direct questions.  Ms. Fossum nodded to personal information from the examiner but offered only one response and did not ask any socially related questions.  Reciprocal conversation was less than typically developed.  She demonstrated adequate use of spontaneous descriptive and emotional gestures but only during specific situations (demonstration and storytelling).  Socially, Ms. Lappe exhibited poorly modulated eye contact.  Her range of facial expression seemed limited, and she did not direct facial expression to the examiner.  Her expression of enjoyment in interaction appeared less than typical with only a slight chuckle.  Ms. Woodin exhibited appropriate ability elaborating on personal emotions but was not able to adequately identify emotions in others during pictures or stories.  Ms. Debord demonstrated some insight into the reciprocal nature of social relationships, but her responses lacked a sense of emotional connection.  Her awareness of responsibility seemed age typical.  Ms. Picone did not initiate any interaction and her responsiveness to social advances was infrequent and limited.  Rapport was difficult to establish due to the lack of initiation and reciprocity.  Ms. Ogarro demonstrated little imaginative and  creativity in her responses or storytelling.  Regarding behavior, Ms. Green did not demonstrate any odd sensory seeking behavior, odd movement, or self-injury, although she mentioned having frequent difficulty in these areas.  Compulsive behavior was not observed.  Overly intense interests were not mentioned during the session, although Ms. Knaggs only demonstrated social interest in the examiner when discussing animals.    Current functioning regarding ASD related symptoms reported during daily activities was assessed using the Social Communication Questionnaire (SCQ).  On this measure, 22 of the 40 items were endorsed by Ms. Inscoe's wife, including at  least two among six of the ASD diagnostic categories.  Multiple items were endorsed in the areas of nonverbal communication, appropriate peer relations, repetitive speech/behavior, social reciprocity, overly intense interests, and ritualistic behavior, with none endorsed for sensory seeking behavior.  Endorsement of at least 15 items is considered at-risk for ASD.    Self-report ratings for ASD related symptoms was conducted using an online rating called the AQ test, developed by Elayne SnareSimon Baron Cohen, Ph.D.  On this measure, Ms. Mather endorsed 47 of the 50 items, which is well above the cutoff of 33, indicating being at-risk for ASD.       Summary:   Ms. Lynwood DawleyMatherly was evaluated during February 2023 related to emotional distress and social interaction difficulty.  Ms. Lynwood DawleyMatherly presents with a history of anxiety and depressed mood related to social interaction impairment and sensory hypersensitivity.  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  Ms. Schleifer's pediatrician suspected Autism Spectrum Disorder (ASD) during her childhood but testing was never conducted. Family history also  significant for ASD.  Testing was recommended to evaluate for ASD as well as other conditions that may be affecting behavior and social interaction.  Test results indicated average overall intelligence (K-BIT 2), with equally developed Verbal Comprehension and Nonverbal Reasoning.  Neurocognitive skills testing indicated age typical attention for simple and complex tasks with a very strong memory but mildly impaired ability to read emotional expression.  Ratings for emotional functioning suggested a high level of trauma related symptoms with moderate depressed mood and anxiety, and mild obsessive-compulsive behavior.  Testing for Autism Spectrum indicated observed difficulty with several aspects of social reciprocity.  While atypical behavior was not observed, much difficulty with ASD related behavior was noted on ratings from Ms. Blow and her wife.  Ms. Lynwood DawleyMatherly appears to meet the criterion for ASD, based on developmental history and current behavior.  Post Traumatic Stress Disorder seems to be the most prominent condition currently while recurrent depressed mood seems to be a moderate concern now.  Recommendations include discussing results with Primary Care Physician or psychiatrist while seeking individual counseling and appropriate community and work-related supports.  See below for further recommendations.        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  Medication for anxiety and depressed mood is recommended to lessen the intensity of Ms. Hipple's emotions.   Individual counseling is recommended to continue to help Ms. Italiano with developing social interaction, emotion regulation, and coping skills.  Ms. Lynwood DawleyMatherly would benefit  from a structured therapy approach that focuses on the teaching of emotional identification and expression, calming, social interaction skills, and perspective taking, while help her to face her previous traumatic experiences emotionally.  It may help Ms. Kantz if she discussed her conditions with her employer to determine the need for any accommodations.  Recommended accommodations may include having extra time to process instructions, along with being able to take breaks when overwhelmed.  Prior notice with alternatives would be needed to help Ms. Dues adjust to any changes in her schedule.   Mental alertness/energy can also be raised by increasing exercise, improving sleep, eating a healthy diet, and managing depression/stress.  Consult with a physician regarding any changes to physical regimen.  Consult the Social Thinking  website for information and training in social skills as well as seeking local support groups for adults with ASD such as the [employer] [facility] [place] and [facility] in [place], the [employer] in [place], and [facility] in [place] C. Kam Kushnir, Ph.D. Licensed Psychologist - [facility] Licensed psychologist [phone]                 Bryson Dames, PhD

## 2021-06-17 NOTE — Progress Notes (Signed)
                Houa Ackert, PhD 
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
